# Patient Record
Sex: Male | Born: 2000
Health system: Southern US, Community
[De-identification: ages and names within clinical notes are randomized; demographics above are authoritative.]

## PROBLEM LIST (undated history)

## (undated) HISTORY — PX: TYMPANOSTOMY TUBE PLACEMENT: SHX32

---
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
## 2019-07-03 ENCOUNTER — Other Ambulatory Visit: Payer: Self-pay | Admitting: Sports Medicine

## 2019-07-03 DIAGNOSIS — L700 Acne vulgaris: Secondary | ICD-10-CM

## 2019-09-13 ENCOUNTER — Ambulatory Visit (INDEPENDENT_AMBULATORY_CARE_PROVIDER_SITE_OTHER): Payer: 59 | Admitting: Sports Medicine

## 2019-09-13 ENCOUNTER — Encounter: Payer: Self-pay | Admitting: Sports Medicine

## 2019-09-13 VITALS — BP 122/67 | HR 87 | Ht 69.0 in | Wt 145.0 lb

## 2019-09-13 DIAGNOSIS — N50812 Left testicular pain: Secondary | ICD-10-CM | POA: Diagnosis not present

## 2019-09-13 DIAGNOSIS — Z23 Encounter for immunization: Secondary | ICD-10-CM | POA: Diagnosis not present

## 2019-09-13 DIAGNOSIS — Z0000 Encounter for general adult medical examination without abnormal findings: Secondary | ICD-10-CM

## 2019-09-13 NOTE — Assessment & Plan Note (Addendum)
Joshua Wheeler returns, he is due for his second meningococcal B vaccine. He is up-to-date on everything else, he is happy, healthy, and can return as needed.

## 2019-09-13 NOTE — Patient Instructions (Signed)
Varicocele  A varicocele is a swelling of veins in the scrotum. The scrotum is the sac that contains the testicles. Varicoceles can occur on either side of the scrotum, but they are more common on the left side. They occur most often in teenage boys and young men. In most cases, varicoceles are not a serious problem. They are usually small and painless and do not require treatment. Tests may be done to confirm the diagnosis. Treatment may be needed if:  A varicocele is large, causes a lot of pain, or causes pain when exercising.  Varicoceles are found on both sides of the scrotum.  A varicocele causes a decrease in the size of the testicle in a growing adolescent.  The person has fertility problems. What are the causes? This condition is the result of valves in the veins not working properly. Valves in the veins help to return blood from the scrotum and testicles to the heart. If these valves do not work well, blood flows backward and backs up into the veins, which causes the veins to swell. This is similar to what happens when varicose veins form in the leg. What are the signs or symptoms? Most varicoceles do not cause any symptoms. If symptoms do occur, they may include:  Swelling on one side of the scrotum. The swelling may be more obvious when you are standing up.  A lumpy feeling in the scrotum.  A heavy feeling on one side of the scrotum.  A dull ache in the scrotum, especially after exercise or prolonged standing or sitting.  Slower growth or reduced size of the testicle on the side of the varicocele (in young males).  Problems with fathering a child (fertility). This can occur if the testicle does not grow normally or if the condition causes problems with the sperm, such as a low sperm count or sperm that are not able to reach the egg (poor motility). How is this diagnosed? This condition is diagnosed based on:  Your medical history.  A physical exam. Your health care  provider may inspect and feel (palpate) the scrotal area to check for swollen or enlarged veins.  An ultrasound. This may be done to confirm the diagnosis and to help rule out other causes of the swelling. How is this treated? Treatment is usually not needed for this condition. If you have any pain, your health care provider may prescribe or recommend medicine to help relieve it. You may need regular exams so your health care provider can monitor the varicocele to ensure that it does not cause problems. When further treatment is needed, it may involve one of these options:  Varicocelectomy. This is a surgery in which the swollen veins are tied off so that the flow of blood goes to other veins instead.  Embolization. In this procedure, a small, thin tube (catheter) is used to place metal coils or other blocking items in the veins. This cuts off the blood flow to the swollen veins. Follow these instructions at home:  Take over-the-counter and prescription medicines only as told by your health care provider.  Wear supportive underwear.  Use an athletic supporter when participating in sports activities.  Keep all follow-up visits as told by your health care provider. This is important. Contact a health care provider if:  Your pain is increasing.  You have redness in the affected area.  Your testicle becomes enlarged, swollen, or painful.  You have swelling that does not decrease when you are lying down.    One of your testicles is smaller than the other. Get help right away if:  You develop swelling in your legs.  You have difficulty breathing. Summary  Varicocele is a condition in which the veins in the scrotum are swollen or enlarged.  In most cases, varicoceles do not require treatment.  Treatment may be needed if you have pain, have problems with infertility, or have a smaller testicle associated with the varicocele.  In some cases, the condition may be treated with a  procedure to cut off the flow of blood to the swollen veins. This information is not intended to replace advice given to you by your health care provider. Make sure you discuss any questions you have with your health care provider. Document Revised: 05/18/2018 Document Reviewed: 05/18/2018 Elsevier Patient Education  2020 [employer].  

## 2019-09-13 NOTE — Progress Notes (Signed)
    Procedures performed today:    None.  Independent interpretation of notes and tests performed by another provider:   None.  Brief History, Exam, Impression, and Recommendations:    Testicular pain, left Joshua Wheeler is a pleasant 19 year old male with history of cystic fibrosis, under adequate control, he is pledging for a fraternity, and ended up getting hit in the testicles with a book, this pain lasted a few months and then improved, subsequently he was wrestling with his sister and also got kneed in the testicle, left side, this is still hurting but better since switching to a more supportive undergarment. On exam he has no penile discharge, no visible abnormalities, he has no testicular masses, no visible or palpable hernias, there is a very mild varicocele bilaterally, interestingly right worse than left. I do not think we need any ultrasound, he will continue to wear supportive and tight underwear as this is the principal treatment for varicocele. He has been screened for STDs within the last month, completely negative. Return as needed.  [name] returns, he is due for his second meningococcal B vaccine. He is up-to-date on everything else, he is happy, healthy, and can return as needed.    ___________________________________________ Ihor Austin. Benjamin Stain, M.D., ABFM., CAQSM. Primary Care and Sports Medicine [facility]  Adjunct Instructor of Family Medicine  [employer]
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
## 2019-10-26 ENCOUNTER — Other Ambulatory Visit: Payer: Self-pay | Admitting: Sports Medicine

## 2019-11-03 ENCOUNTER — Other Ambulatory Visit: Payer: Self-pay | Admitting: Sports Medicine

## 2019-11-03 DIAGNOSIS — L700 Acne vulgaris: Secondary | ICD-10-CM

## 2019-11-27 ENCOUNTER — Encounter: Payer: Self-pay | Admitting: Nurse Practitioner

## 2019-11-27 DIAGNOSIS — R7303 Prediabetes: Secondary | ICD-10-CM | POA: Insufficient documentation

## 2019-12-16 ENCOUNTER — Other Ambulatory Visit: Payer: Self-pay | Admitting: Sports Medicine

## 2019-12-21 IMAGING — CR DG CHEST 2V
2 series · 2 of 2 positions shown · non-contrast
Comparison: None.

CLINICAL DATA: History of cystic fibrosis.  Fever and malaise.

EXAM:
CHEST - 2 VIEW

[chest pa]
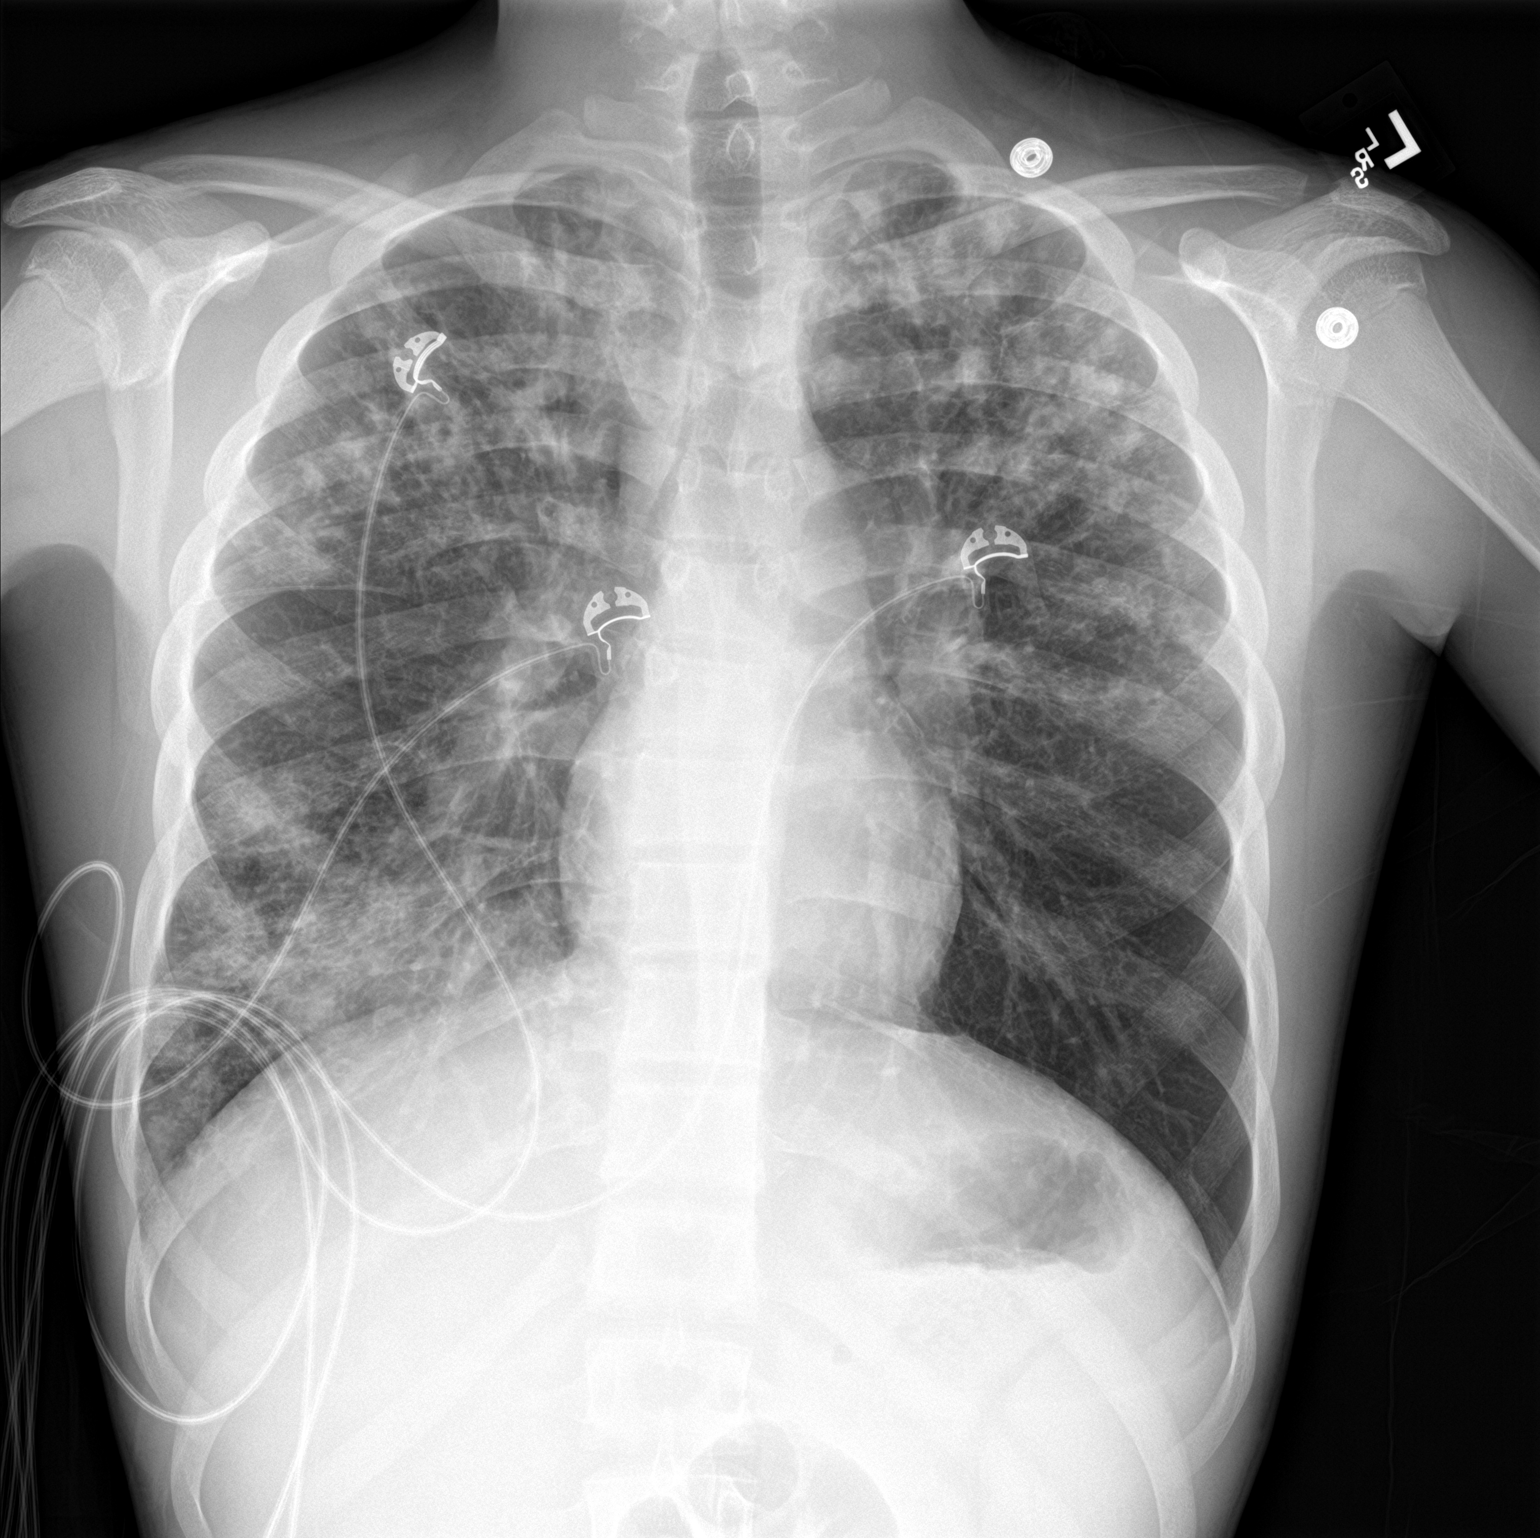

[chest lat]
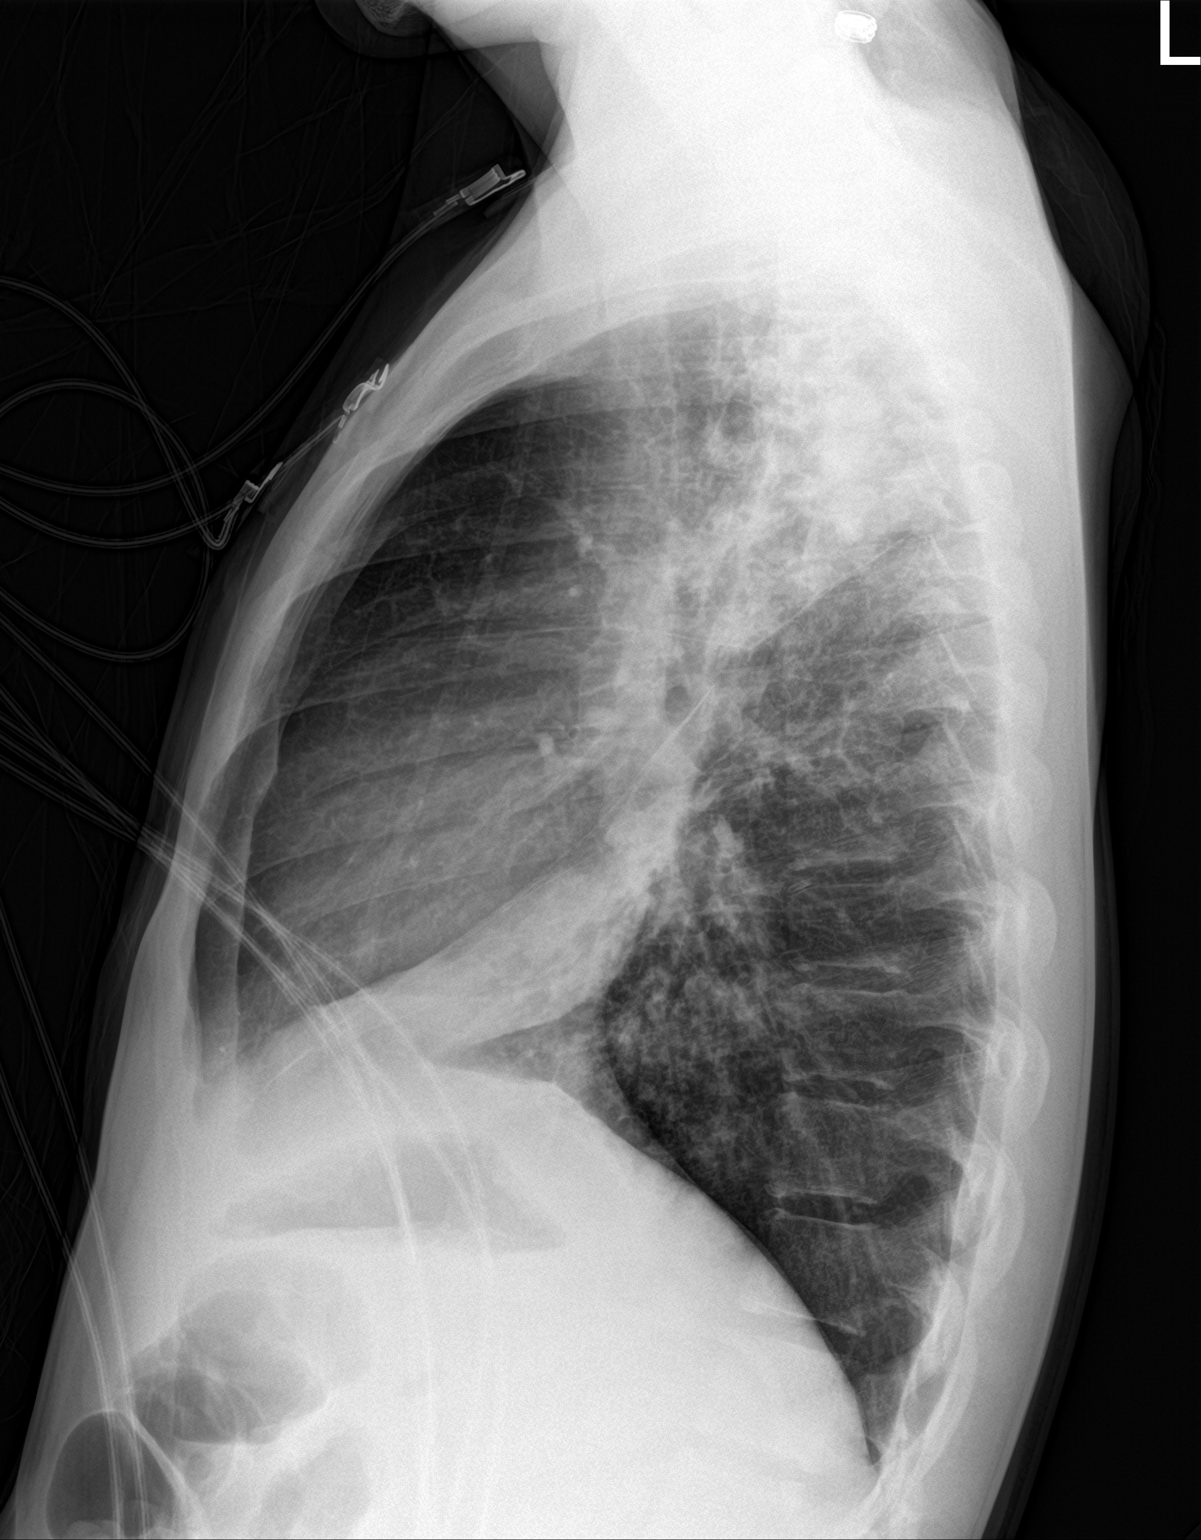

[2 of 2 positions shown; findings below may reference images not displayed]

FINDINGS: There are extensive bilateral upper lobe opacities in a pattern
consistent with cystic fibrosis. There is an area of consolidation
in the right lower lobe that is more suggestive of pneumonia. The
left lung base is clear. Cardiomediastinal contours are normal.
There is right middle lobe atelectasis.
IMPRESSION: 1. Consolidation in the right lower lobe, suggestive of pneumonia.
2. Bilateral upper lobe changes of cystic fibrosis.

## 2020-12-29 ENCOUNTER — Other Ambulatory Visit: Payer: Self-pay | Admitting: Sports Medicine

## 2020-12-31 ENCOUNTER — Other Ambulatory Visit (HOSPITAL_COMMUNITY): Payer: Self-pay

## 2021-01-01 ENCOUNTER — Other Ambulatory Visit (HOSPITAL_COMMUNITY): Payer: Self-pay

## 2021-01-01 MED ORDER — TRIKAFTA 100-50-75 & 150 MG PO TBPK
ORAL_TABLET | ORAL | 0 refills | Status: DC
  Filled 2021-01-01 – 2021-01-04 (×3): qty 84, 28d supply, fill #0

## 2021-01-04 ENCOUNTER — Other Ambulatory Visit (HOSPITAL_COMMUNITY): Payer: Self-pay

## 2021-01-04 ENCOUNTER — Ambulatory Visit: Payer: Self-pay | Attending: Family Medicine | Admitting: Pharmacist

## 2021-01-04 ENCOUNTER — Other Ambulatory Visit: Payer: Self-pay | Admitting: Pharmacist

## 2021-01-04 DIAGNOSIS — Z79899 Other long term (current) drug therapy: Secondary | ICD-10-CM

## 2021-01-04 MED ORDER — TRIKAFTA 100-50-75 & 150 MG PO TBPK
ORAL_TABLET | ORAL | 0 refills | Status: DC
  Filled 2021-01-04: qty 84, 28d supply, fill #0

## 2021-01-04 NOTE — Progress Notes (Signed)
Patient presents for review of their specialty medication therapy.    Adherence: confirmed with Trikafta. Pt has been taking this for some time now.   Efficacy: reports good results with Trikafta.   Dosing:    - Trikafta:             - Two tablets (elexacaftor 185m/tezacaftor 50 mg/ivacaftor 75 mg) qAM             - One tablet (ivacaftor 150 mg) qPM             - Take with a fat-containing meal or snack.   Renal dose adjustments: - Trikafta: eGFR <30 has not been studied; use with caution.   Hepatic dose adjustments:   -Trikafta: Child-Pugh class B - Use only if the benefits outweigh the risks. Reduce dose to 2 tablets (each containing elexacaftor 100 mg/tezacaftor 50 mg/ivacaftor 75 mg per tablet) in the morning (omit the evening ivacaftor dose). Child-Pugh Class C - use is not recommended. Hepatotoxicity during treatment -  ALT/AST >5  ULN or ALT/AST >3  ULN with bilirubin >2  ULN: Interrupt therapy until resolved; consider the benefits and risks prior to resuming.   Monitoring   Trikafta:  Headache: none reported  Abdominal pain/diarrhea: none reported  Liver transaminase abnormalities: none reported  S/sx of URI: none reported  Visual disturbances: none reported    O: Lab work completed by his specialist.    A/P: 1. Medication review: Patient currently on TBudfor CF. Reviewed the medication with the patient, including the following: Trikafta is comprised of elexacaftor, tezacaftor and ivacaftor. Elexacaftor and tezacaftor facilitate the cellular processing and trafficking of normal and select mutant forms of CFTR to increase the amount of mature CFTR protein delivered to the cell surface. Ivacaftor is a CFTR potentiator that facilitates increased chloride transport by gating the CFTR protein at the cell surface. Patient educated on purpose, proper use, and potential adverse effects of Trikafta. Possible adverse effects include headache, abdominal pain, diarrhea, and  liver enzyme abnormalities. Swallow tablets whole with a fat-containing meal or snack. No recommendations for any changes at this time.   LBenard Halsted PharmD, BPara [phone]

## 2021-01-05 ENCOUNTER — Other Ambulatory Visit (HOSPITAL_COMMUNITY): Payer: Self-pay

## 2021-01-26 ENCOUNTER — Other Ambulatory Visit (HOSPITAL_COMMUNITY): Payer: Self-pay

## 2021-02-01 ENCOUNTER — Other Ambulatory Visit: Payer: Self-pay | Admitting: Pharmacist

## 2021-02-01 ENCOUNTER — Other Ambulatory Visit (HOSPITAL_COMMUNITY): Payer: Self-pay

## 2021-02-01 MED ORDER — TRIKAFTA 100-50-75 & 150 MG PO TBPK: ORAL_TABLET | ORAL | 0 refills | Status: DC

## 2021-02-01 MED ORDER — TRIKAFTA 100-50-75 & 150 MG PO TBPK
ORAL_TABLET | ORAL | 0 refills | Status: DC
  Filled 2021-02-01: qty 84, 28d supply, fill #0

## 2021-02-02 ENCOUNTER — Other Ambulatory Visit (HOSPITAL_COMMUNITY): Payer: Self-pay

## 2021-03-01 ENCOUNTER — Other Ambulatory Visit: Payer: Self-pay | Admitting: Pharmacist

## 2021-03-01 ENCOUNTER — Other Ambulatory Visit (HOSPITAL_COMMUNITY): Payer: Self-pay

## 2021-03-01 MED ORDER — TRIKAFTA 100-50-75 & 150 MG PO TBPK
ORAL_TABLET | ORAL | 0 refills | Status: DC
  Filled 2021-03-01: qty 84, fill #0
  Filled 2021-03-01: qty 84, 28d supply, fill #0

## 2021-03-01 MED ORDER — TRIKAFTA 100-50-75 & 150 MG PO TBPK: ORAL_TABLET | ORAL | 0 refills | Status: DC

## 2021-03-04 ENCOUNTER — Other Ambulatory Visit (HOSPITAL_COMMUNITY): Payer: Self-pay

## 2021-03-04 DIAGNOSIS — K8689 Other specified diseases of pancreas: Secondary | ICD-10-CM | POA: Diagnosis not present

## 2021-03-04 DIAGNOSIS — Z882 Allergy status to sulfonamides status: Secondary | ICD-10-CM | POA: Diagnosis not present

## 2021-03-04 DIAGNOSIS — Z23 Encounter for immunization: Secondary | ICD-10-CM | POA: Diagnosis not present

## 2021-03-04 DIAGNOSIS — Z6822 Body mass index (BMI) 22.0-22.9, adult: Secondary | ICD-10-CM | POA: Diagnosis not present

## 2021-03-10 ENCOUNTER — Other Ambulatory Visit (HOSPITAL_COMMUNITY): Payer: Self-pay

## 2021-03-11 ENCOUNTER — Other Ambulatory Visit (HOSPITAL_COMMUNITY): Payer: Self-pay

## 2021-03-31 ENCOUNTER — Other Ambulatory Visit (HOSPITAL_COMMUNITY): Payer: Self-pay

## 2021-04-01 ENCOUNTER — Other Ambulatory Visit: Payer: Self-pay | Admitting: Pharmacist

## 2021-04-01 ENCOUNTER — Other Ambulatory Visit (HOSPITAL_COMMUNITY): Payer: Self-pay

## 2021-04-01 MED ORDER — TRIKAFTA 100-50-75 & 150 MG PO TBPK: ORAL_TABLET | ORAL | 3 refills | Status: DC

## 2021-04-01 MED ORDER — TRIKAFTA 100-50-75 & 150 MG PO TBPK
ORAL_TABLET | ORAL | 3 refills | Status: AC
Start: 2021-04-01 — End: ?
  Filled 2021-04-01: qty 84, 28d supply, fill #0
  Filled 2021-04-28: qty 84, 28d supply, fill #1
  Filled 2021-05-24: qty 84, 28d supply, fill #2
  Filled 2021-06-23: qty 84, 28d supply, fill #3
  Filled 2021-08-03 (×2): qty 84, 28d supply, fill #4
  Filled 2021-09-06: qty 84, 28d supply, fill #5
  Filled 2021-10-08 – 2021-11-12 (×2): qty 84, 28d supply, fill #6
  Filled 2021-12-10 – 2022-01-03 (×2): qty 84, 28d supply, fill #7
  Filled 2022-01-26: qty 84, 28d supply, fill #8
  Filled 2022-02-17: qty 84, 28d supply, fill #9
  Filled 2022-03-16: qty 84, 28d supply, fill #10

## 2021-04-02 ENCOUNTER — Other Ambulatory Visit (HOSPITAL_COMMUNITY): Payer: Self-pay

## 2021-04-05 ENCOUNTER — Other Ambulatory Visit (HOSPITAL_COMMUNITY): Payer: Self-pay

## 2021-04-06 ENCOUNTER — Other Ambulatory Visit (HOSPITAL_COMMUNITY): Payer: Self-pay

## 2021-04-26 ENCOUNTER — Other Ambulatory Visit (HOSPITAL_COMMUNITY): Payer: Self-pay

## 2021-04-28 ENCOUNTER — Other Ambulatory Visit (HOSPITAL_COMMUNITY): Payer: Self-pay

## 2021-04-29 ENCOUNTER — Other Ambulatory Visit (HOSPITAL_COMMUNITY): Payer: Self-pay

## 2021-05-01 ENCOUNTER — Other Ambulatory Visit (HOSPITAL_COMMUNITY): Payer: Self-pay

## 2021-05-24 ENCOUNTER — Other Ambulatory Visit (HOSPITAL_COMMUNITY): Payer: Self-pay

## 2021-05-25 ENCOUNTER — Other Ambulatory Visit (HOSPITAL_COMMUNITY): Payer: Self-pay

## 2021-05-25 MED ORDER — PANCRELIPASE (LIP-PROT-AMYL) 24000-76000 UNITS PO CPEP
ORAL_CAPSULE | ORAL | 10 refills | Status: AC
  Filled 2021-05-25 (×2): qty 1000, 34d supply, fill #0
  Filled 2021-08-03: qty 1000, 34d supply, fill #1
  Filled 2021-09-06: qty 155, 7d supply, fill #1
  Filled 2021-09-07: qty 845, 38d supply, fill #1
  Filled 2021-11-12 – 2022-01-07 (×3): qty 1000, 45d supply, fill #2

## 2021-05-26 ENCOUNTER — Other Ambulatory Visit (HOSPITAL_COMMUNITY): Payer: Self-pay

## 2021-05-31 ENCOUNTER — Other Ambulatory Visit (HOSPITAL_COMMUNITY): Payer: Self-pay

## 2021-06-17 ENCOUNTER — Other Ambulatory Visit (HOSPITAL_COMMUNITY): Payer: Self-pay

## 2021-06-21 ENCOUNTER — Other Ambulatory Visit (HOSPITAL_COMMUNITY): Payer: Self-pay

## 2021-06-23 ENCOUNTER — Other Ambulatory Visit (HOSPITAL_COMMUNITY): Payer: Self-pay

## 2021-06-24 ENCOUNTER — Other Ambulatory Visit (HOSPITAL_COMMUNITY): Payer: Self-pay

## 2021-07-07 ENCOUNTER — Other Ambulatory Visit (HOSPITAL_COMMUNITY): Payer: Self-pay

## 2021-07-19 ENCOUNTER — Other Ambulatory Visit (HOSPITAL_COMMUNITY): Payer: Self-pay

## 2021-08-03 ENCOUNTER — Other Ambulatory Visit (HOSPITAL_COMMUNITY): Payer: Self-pay

## 2021-08-04 ENCOUNTER — Other Ambulatory Visit (HOSPITAL_COMMUNITY): Payer: Self-pay

## 2021-08-27 ENCOUNTER — Other Ambulatory Visit (HOSPITAL_COMMUNITY): Payer: Self-pay

## 2021-09-01 ENCOUNTER — Other Ambulatory Visit (HOSPITAL_COMMUNITY): Payer: Self-pay

## 2021-09-02 ENCOUNTER — Other Ambulatory Visit (HOSPITAL_COMMUNITY): Payer: Self-pay

## 2021-09-06 ENCOUNTER — Other Ambulatory Visit (HOSPITAL_COMMUNITY): Payer: Self-pay

## 2021-09-07 ENCOUNTER — Other Ambulatory Visit (HOSPITAL_COMMUNITY): Payer: Self-pay

## 2021-09-30 ENCOUNTER — Other Ambulatory Visit (HOSPITAL_COMMUNITY): Payer: Self-pay

## 2021-10-08 ENCOUNTER — Other Ambulatory Visit (HOSPITAL_COMMUNITY): Payer: Self-pay

## 2021-10-15 ENCOUNTER — Other Ambulatory Visit (HOSPITAL_COMMUNITY): Payer: Self-pay

## 2021-10-29 ENCOUNTER — Other Ambulatory Visit (HOSPITAL_COMMUNITY): Payer: Self-pay

## 2021-11-03 ENCOUNTER — Other Ambulatory Visit (HOSPITAL_COMMUNITY): Payer: Self-pay

## 2021-11-05 ENCOUNTER — Other Ambulatory Visit (HOSPITAL_COMMUNITY): Payer: Self-pay

## 2021-11-08 ENCOUNTER — Other Ambulatory Visit (HOSPITAL_COMMUNITY): Payer: Self-pay

## 2021-11-12 ENCOUNTER — Other Ambulatory Visit (HOSPITAL_COMMUNITY): Payer: Self-pay

## 2021-11-16 ENCOUNTER — Other Ambulatory Visit (HOSPITAL_COMMUNITY): Payer: Self-pay

## 2021-11-22 ENCOUNTER — Other Ambulatory Visit (HOSPITAL_COMMUNITY): Payer: Self-pay

## 2021-11-23 ENCOUNTER — Other Ambulatory Visit (HOSPITAL_COMMUNITY): Payer: Self-pay

## 2021-11-27 ENCOUNTER — Other Ambulatory Visit (HOSPITAL_COMMUNITY): Payer: Self-pay

## 2021-12-03 ENCOUNTER — Other Ambulatory Visit (HOSPITAL_COMMUNITY): Payer: Self-pay

## 2021-12-10 ENCOUNTER — Other Ambulatory Visit (HOSPITAL_COMMUNITY): Payer: Self-pay

## 2021-12-14 ENCOUNTER — Other Ambulatory Visit (HOSPITAL_COMMUNITY): Payer: Self-pay

## 2021-12-23 ENCOUNTER — Telehealth: Payer: Self-pay | Admitting: Pharmacist

## 2021-12-23 NOTE — Telephone Encounter (Signed)
Called patient to schedule an appointment for the [facility]. I was unable to reach the patient so I left a HIPAA-compliant message requesting that the patient return my call.   Luke Van Ausdall, PharmD, BCACP, CPP Clinical Pharmacist [facility] [phone]  

## 2022-01-01 ENCOUNTER — Other Ambulatory Visit (HOSPITAL_COMMUNITY): Payer: Self-pay

## 2022-01-03 ENCOUNTER — Other Ambulatory Visit (HOSPITAL_COMMUNITY): Payer: Self-pay

## 2022-01-07 ENCOUNTER — Other Ambulatory Visit (HOSPITAL_COMMUNITY): Payer: Self-pay

## 2022-01-25 ENCOUNTER — Other Ambulatory Visit (HOSPITAL_COMMUNITY): Payer: Self-pay

## 2022-01-26 ENCOUNTER — Other Ambulatory Visit (HOSPITAL_COMMUNITY): Payer: Self-pay

## 2022-01-27 ENCOUNTER — Other Ambulatory Visit (HOSPITAL_COMMUNITY): Payer: Self-pay

## 2022-01-28 ENCOUNTER — Other Ambulatory Visit (HOSPITAL_COMMUNITY): Payer: Self-pay

## 2022-02-17 ENCOUNTER — Other Ambulatory Visit (HOSPITAL_COMMUNITY): Payer: Self-pay

## 2022-02-18 ENCOUNTER — Ambulatory Visit: Payer: Self-pay | Attending: Sports Medicine | Admitting: Pharmacist

## 2022-02-18 ENCOUNTER — Other Ambulatory Visit (HOSPITAL_COMMUNITY): Payer: Self-pay

## 2022-02-18 DIAGNOSIS — Z79899 Other long term (current) drug therapy: Secondary | ICD-10-CM

## 2022-02-18 NOTE — Progress Notes (Signed)
Patient presents for review of their specialty medication therapy.    Adherence: confirmed with Trikafta. Pt has been taking this for some time now.   Efficacy: reports good results with Trikafta.   Dosing:    - Trikafta:             - Two tablets (elexacaftor 154m/tezacaftor 50 mg/ivacaftor 75 mg) qAM             - One tablet (ivacaftor 150 mg) qPM             - Take with a fat-containing meal or snack.   Renal dose adjustments: - Trikafta: eGFR <30 has not been studied; use with caution.   Hepatic dose adjustments:   -Trikafta: Child-Pugh class B - Use only if the benefits outweigh the risks. Reduce dose to 2 tablets (each containing elexacaftor 100 mg/tezacaftor 50 mg/ivacaftor 75 mg per tablet) in the morning (omit the evening ivacaftor dose). Child-Pugh Class C - use is not recommended. Hepatotoxicity during treatment -  ALT/AST >5  ULN or ALT/AST >3  ULN with bilirubin >2  ULN: Interrupt therapy until resolved; consider the benefits and risks prior to resuming.   Monitoring   Trikafta:  Headache: none reported  Abdominal pain/diarrhea: none reported  Liver transaminase abnormalities: none reported  S/sx of URI: none reported  Visual disturbances: none reported    O: Lab work completed by his specialist.    A/P: 1. Medication review: [place]. Reviewed the medication with the patient, including the following: Trikafta is comprised of elexacaftor, tezacaftor and ivacaftor. Elexacaftor and tezacaftor facilitate the cellular processing and trafficking of normal and select mutant forms of CFTR to increase the amount of mature CFTR protein delivered to the cell surface. Ivacaftor is a CFTR potentiator that facilitates increased chloride transport by gating the CFTR protein at the cell surface. Patient educated on purpose, proper use, and potential adverse effects of Trikafta. Possible adverse effects include headache, abdominal pain, diarrhea, and  liver enzyme abnormalities. Swallow tablets whole with a fat-containing meal or snack. No recommendations for any changes at this time.   LBenard Halsted PharmD, BPara [phone]

## 2022-02-21 ENCOUNTER — Other Ambulatory Visit (HOSPITAL_COMMUNITY): Payer: Self-pay

## 2022-02-24 ENCOUNTER — Other Ambulatory Visit (HOSPITAL_COMMUNITY): Payer: Self-pay

## 2022-03-16 ENCOUNTER — Other Ambulatory Visit (HOSPITAL_COMMUNITY): Payer: Self-pay

## 2022-03-17 ENCOUNTER — Other Ambulatory Visit (HOSPITAL_COMMUNITY): Payer: Self-pay

## 2022-03-18 ENCOUNTER — Other Ambulatory Visit (HOSPITAL_COMMUNITY): Payer: Self-pay

## 2022-04-08 ENCOUNTER — Other Ambulatory Visit (HOSPITAL_COMMUNITY): Payer: Self-pay

## 2022-04-20 ENCOUNTER — Other Ambulatory Visit (HOSPITAL_COMMUNITY): Payer: Self-pay

## 2022-04-21 ENCOUNTER — Other Ambulatory Visit (HOSPITAL_COMMUNITY): Payer: Self-pay

## 2022-04-25 ENCOUNTER — Other Ambulatory Visit (HOSPITAL_COMMUNITY): Payer: Self-pay

## 2022-04-26 ENCOUNTER — Other Ambulatory Visit (HOSPITAL_COMMUNITY): Payer: Self-pay

## 2022-04-27 ENCOUNTER — Other Ambulatory Visit (HOSPITAL_COMMUNITY): Payer: Self-pay

## 2022-04-28 ENCOUNTER — Other Ambulatory Visit (HOSPITAL_COMMUNITY): Payer: Self-pay

## 2022-04-29 ENCOUNTER — Other Ambulatory Visit (HOSPITAL_COMMUNITY): Payer: Self-pay

## 2022-05-02 ENCOUNTER — Other Ambulatory Visit (HOSPITAL_COMMUNITY): Payer: Self-pay

## 2022-05-04 ENCOUNTER — Other Ambulatory Visit (HOSPITAL_COMMUNITY): Payer: Self-pay

## 2022-05-14 ENCOUNTER — Other Ambulatory Visit (HOSPITAL_COMMUNITY): Payer: Self-pay

## 2022-05-26 ENCOUNTER — Other Ambulatory Visit (HOSPITAL_COMMUNITY): Payer: Self-pay

## 2022-06-23 ENCOUNTER — Other Ambulatory Visit (HOSPITAL_COMMUNITY): Payer: Self-pay

## 2022-06-24 ENCOUNTER — Other Ambulatory Visit (HOSPITAL_COMMUNITY): Payer: Self-pay

## 2022-07-07 ENCOUNTER — Other Ambulatory Visit (HOSPITAL_COMMUNITY): Payer: Self-pay

## 2022-08-12 ENCOUNTER — Other Ambulatory Visit (HOSPITAL_COMMUNITY): Payer: Self-pay

## 2022-11-03 ENCOUNTER — Other Ambulatory Visit (HOSPITAL_COMMUNITY): Payer: Self-pay

## 2022-12-03 ENCOUNTER — Encounter (HOSPITAL_COMMUNITY): Payer: Self-pay

## 2023-11-16 ENCOUNTER — Encounter: Payer: Self-pay | Admitting: Sports Medicine
